# Patient Record
Sex: Male | Born: 1960 | Race: White | Hispanic: No | Marital: Single | Smoking: Current every day smoker
Health system: Southern US, Community
[De-identification: ages and names within clinical notes are randomized; demographics above are authoritative.]

## PROBLEM LIST (undated history)

## (undated) DIAGNOSIS — J45909 Unspecified asthma, uncomplicated: Secondary | ICD-10-CM

## (undated) DIAGNOSIS — J439 Emphysema, unspecified: Secondary | ICD-10-CM

## (undated) DIAGNOSIS — J449 Chronic obstructive pulmonary disease, unspecified: Secondary | ICD-10-CM

## (undated) HISTORY — DX: Unspecified asthma, uncomplicated: J45.909

## (undated) HISTORY — PX: KNEE SURGERY: SHX244

## (undated) HISTORY — DX: Chronic obstructive pulmonary disease, unspecified: J44.9

## (undated) HISTORY — PX: HERNIA REPAIR: SHX51

## (undated) HISTORY — DX: Emphysema, unspecified: J43.9

---

## 2012-12-07 ENCOUNTER — Emergency Department: Payer: Self-pay | Admitting: Emergency Medicine

## 2012-12-17 ENCOUNTER — Emergency Department: Payer: Self-pay | Admitting: Emergency Medicine

## 2012-12-17 LAB — CBC
HCT: 39.1 % — ABNORMAL LOW (ref 40.0–52.0)
MCH: 31.4 pg (ref 26.0–34.0)
MCV: 93 fL (ref 80–100)
Platelet: 210 10*3/uL (ref 150–440)
RBC: 4.23 10*6/uL — ABNORMAL LOW (ref 4.40–5.90)
WBC: 13.4 10*3/uL — ABNORMAL HIGH (ref 3.8–10.6)

## 2012-12-17 LAB — BASIC METABOLIC PANEL
Anion Gap: 6 — ABNORMAL LOW (ref 7–16)
EGFR (Non-African Amer.): 58 — ABNORMAL LOW
Glucose: 101 mg/dL — ABNORMAL HIGH (ref 65–99)
Osmolality: 284 (ref 275–301)

## 2013-02-13 ENCOUNTER — Emergency Department: Payer: Self-pay | Admitting: Emergency Medicine

## 2015-09-23 ENCOUNTER — Emergency Department
Admission: EM | Admit: 2015-09-23 | Discharge: 2015-09-23 | Disposition: A | Discharge status: Home / Self Care | Payer: Self-pay | Attending: Emergency Medicine | Admitting: Emergency Medicine

## 2015-09-23 ENCOUNTER — Encounter: Payer: Self-pay | Admitting: Emergency Medicine

## 2015-09-23 DIAGNOSIS — K047 Periapical abscess without sinus: Secondary | ICD-10-CM | POA: Insufficient documentation

## 2015-09-23 MED ORDER — IBUPROFEN 600 MG PO TABS: 600.0000 mg | ORAL_TABLET | Freq: Three times a day (TID) | ORAL | 0 refills | Status: DC | PRN

## 2015-09-23 MED ORDER — AMOXICILLIN 500 MG PO CAPS: 500.0000 mg | ORAL_CAPSULE | Freq: Three times a day (TID) | ORAL | 0 refills | Status: DC

## 2015-09-23 NOTE — ED Provider Notes (Signed)
[facility] Emergency Department Provider Note   ____________________________________________   First MD Initiated Contact with Patient 09/23/15 [phone]     (approximate)  I have reviewed the triage vital signs and the nursing notes.   HISTORY  Chief Complaint Oral Swelling    HPI Bobby Payne is a 55 y.o. male is here with complaint of dental pain.Patient states he broke a crown off several months ago but in the last couple of days has developed pain along with facial swelling. Patient states that the swelling gradually got worse last evening and he has been taking ibuprofen with no relief. Patient states that he tried to contact a dentist without success. He denies any fever or chills. There's been no nausea vomiting. Currently he rates his pain as 6/10.   History reviewed. No pertinent past medical history.  There are no active problems to display for this patient.   History reviewed. No pertinent surgical history.  Prior to Admission medications   Medication Sig Start Date End Date Taking? Authorizing Provider  amoxicillin (AMOXIL) 500 MG capsule Take 1 capsule (500 mg total) by mouth 3 (three) times daily. 09/23/15   Tommi Rumps, PA-C  ibuprofen (ADVIL,MOTRIN) 600 MG tablet Take 1 tablet (600 mg total) by mouth every 8 (eight) hours as needed. 09/23/15   Tommi Rumps, PA-C    Allergies Review of patient's allergies indicates no known allergies.  No family history on file.  Social History Social History  Substance Use Topics  . Smoking status: Not on file  . Smokeless tobacco: Not on file  . Alcohol use Not on file    Review of Systems Constitutional: No fever/chills Cardiovascular: Denies chest pain. Respiratory: Denies shortness of breath. Gastrointestinal:   No nausea, no vomiting.   Musculoskeletal: Negative for back pain. Skin: Negative for rash. Neurological: Negative for headaches, focal weakness or  numbness.  10-point ROS otherwise negative.  ____________________________________________   PHYSICAL EXAM:  VITAL SIGNS: ED Triage Vitals  Enc Vitals Group     BP 09/23/15 0915 (!) 142/85     Pulse Rate 09/23/15 0915 68     Resp 09/23/15 0915 18     Temp 09/23/15 0915 97.6 F (36.4 C)     Temp Source 09/23/15 0915 Oral     SpO2 09/23/15 0915 96 %     Weight 09/23/15 0918 186 lb (84.4 kg)     Height 09/23/15 0918 5\' 9"  (1.753 m)     Head Circumference --      Peak Flow --      Pain Score 09/23/15 0918 6     Pain Loc --      Pain Edu? --      Excl. in GC? --     Constitutional: Alert and oriented. Well appearing and in no acute distress. Eyes: Conjunctivae are normal. PERRL. EOMI. Head: Atraumatic. Nose: No congestion/rhinnorhea. Mouth/Throat: Mucous membranes are moist.  Oropharynx non-erythematous.Right upper gum with edema and tenderness. No active drainage was seen. Patient currently wearing a bridge that was removed for exam. Neck: No stridor.   Hematological/Lymphatic/Immunilogical: No cervical lymphadenopathy. Cardiovascular: Normal rate, regular rhythm. Grossly normal heart sounds.  Good peripheral circulation. Respiratory: Normal respiratory effort.  No retractions. Lungs CTAB. Musculoskeletal: No lower extremity tenderness nor edema.  No joint effusions. Neurologic:  Normal speech and language. No gross focal neurologic deficits are appreciated.  Skin:  Skin is warm, dry and intact. No rash noted. Psychiatric: Mood and affect are normal.  Speech and behavior are normal.  ____________________________________________   LABS (all labs ordered are listed, but only abnormal results are displayed)  Labs Reviewed - No data to display   PROCEDURES  Procedure(s) performed: None  Procedures  Critical Care performed: No  ____________________________________________   INITIAL IMPRESSION / ASSESSMENT AND PLAN / ED COURSE  Pertinent labs & imaging results that  were available during my care of the patient were reviewed by me and considered in my medical decision making (see chart for details).    Clinical Course   Patient was given a prescription for amoxicillin 500 mg 3 times a day for 10 days. He is also given a prescription for an ibuprofen to be taken for pain and inflammation. He was also given list of all the dental clinics that he is eligible for since he does not have any insurance.  ____________________________________________   FINAL CLINICAL IMPRESSION(S) / ED DIAGNOSES  Final diagnoses:  Dental abscess      NEW MEDICATIONS STARTED DURING THIS VISIT:  Discharge Medication List as of 09/23/2015  9:38 AM    START taking these medications   Details  amoxicillin (AMOXIL) 500 MG capsule Take 1 capsule (500 mg total) by mouth 3 (three) times daily., Starting Sat 09/23/2015, Print    ibuprofen (ADVIL,MOTRIN) 600 MG tablet Take 1 tablet (600 mg total) by mouth every 8 (eight) hours as needed., Starting Sat 09/23/2015, Print         Note:  This document was prepared using Dragon voice recognition software and may include unintentional dictation errors.    Tommi RumpsRhonda L Rodman Recupero, PA-C 09/23/15 1202    Sharyn CreamerMark Quale, MD 09/23/15 1535

## 2015-09-23 NOTE — ED Triage Notes (Signed)
Awoke with swollen face this am R side, states has crown that came off that side.

## 2015-09-23 NOTE — ED Notes (Signed)
Pt alert and oriented X4, active, cooperative, pt in NAD. RR even and unlabored, color WNL.  Pt informed to return if any life threatening symptoms occur.   

## 2015-09-23 NOTE — ED Notes (Signed)
Pt comes in to ED w/ c/o of R. Sided dental pain. R. Cheek swelling noted upon assessment. Per pt this began last night and swelling has gradually increased-pain 6/10. States took ibuprofen with no relief, tried to contact dentist w/ no success. Significant dental hx, thinks "may have infection from broken crown". Denies difficulty breathing. NAD noted.

## 2015-09-23 NOTE — Discharge Instructions (Signed)
OPTIONS FOR DENTAL FOLLOW UP CARE ° °[place] Department of Health and Human Services - Local Safety Net Dental Clinics °http://www.ncdhhs.gov/dph/oralhealth/services/safetynetclinics.htm °  °[facility] (336-562-3123) ° °[facility] [place] (919-933-9087) ° °[facility] (919-663-1744 ext [phone]) ° °[facility] ([phone]) ° °[facility] ([phone]) °This clinic caters to the indigent population and is on a lottery system. °Location: °[facility], [address], [place] °Clinic Hours: °Wednesdays from 6pm - 9pm, patients seen by a lottery system. °For dates, call or go to [email or web address].[facility].edu/[facility]/patients/Dental-[facility] °Services: °Cleanings, fillings and simple extractions. °Payment Options: °DENTAL WORK IS FREE OF CHARGE. Bring proof of income or support. °Best way to get seen: °Arrive at 5:15 pm - this is a lottery, NOT first come/first serve, so arriving earlier will not increase your chances of being seen. °  °  °[facility] °[phone] °Select option 1 for emergencies °  °Location: °[facility], [address], [place] °Clinic Hours: °No walk-ins accepted - call the day before to schedule an appointment. °Check in times are 9:30 am and 1:30 pm. °Services: °Simple extractions, temporary fillings, pulpectomy/pulp debridement, uncomplicated abscess drainage. °Payment Options: °PAYMENT IS DUE AT THE TIME OF SERVICE.  Fee is usually $100-200, additional surgical procedures (e.g. abscess drainage) may be extra. °Cash, checks, Visa/MasterCard accepted.  Can file Medicaid if patient is covered for dental - patient should call case worker to check. °No discount for [facility] patients. °Best way to get seen: °MUST call the day before and get onto the schedule. Can usually be seen the next 1-2 days. No walk-ins accepted. °  °  °[facility] °[phone] °   °Location: °[facility], [address], [place] °Clinic Hours: °M, W, Th, F 8am or 1:30pm, Tues 9a or 1:30 - first come/first served. °Services: °Simple extractions, temporary fillings, uncomplicated abscess drainage.  You do not need to be an [place] resident. °Payment Options: °PAYMENT IS DUE AT THE TIME OF SERVICE. °Dental insurance, otherwise sliding scale - bring proof of income or support. °Depending on income and treatment needed, cost is usually $50-200. °Best way to get seen: °Arrive early as it is first come/first served. °  °  °[facility] °[phone] °  °Location: °[address] °Clinic Hours: °Mon-Thu 8a-5p °Services: °Most basic dental services including extractions and fillings. °Payment Options: °PAYMENT IS DUE AT THE TIME OF SERVICE. °Sliding scale, up to 50% off - bring proof if income or support. °Medicaid with dental option accepted. °Best way to get seen: °Call to schedule an appointment, can usually be seen within 2 weeks OR they will try to see walk-ins - show up at 8a or 2p (you may have to wait). °  °  °[facility] °919-245-2435 °[place] RESIDENTS ONLY °  °Location: °[facility], [address], [place], [place] [address] °Clinic Hours: By appointment only. °Monday - Thursday 8am-5pm, Friday 8am-12pm °Services: Cleanings, fillings, extractions. °Payment Options: °PAYMENT IS DUE AT THE TIME OF SERVICE. °Cash, Visa or MasterCard. Sliding scale - $30 minimum per service. °Best way to get seen: °Come in to office, complete packet and make an appointment - need proof of income °or support monies for each household member and proof of [place] residence. °Usually takes about a month to get in. °  °  °[facility] °[phone] °  °Location: °[address]   [place] °Clinic Hours: Walk-in Urgent Care Dental Services are offered Monday-Friday  mornings only. °The numbers of emergencies accepted daily is limited to the number of °providers available. °Maximum 15 - Mondays, Wednesdays & Thursdays °Maximum 10 - Tuesdays & Fridays °Services: °You do not need to be a [place] resident to be seen for a dental emergency. °Emergencies are defined as pain, swelling, abnormal bleeding, or dental trauma. Walkins will receive x-rays if needed. °NOTE: Dental cleaning is not an emergency. °Payment Options: °PAYMENT IS DUE AT THE TIME OF SERVICE. °Minimum co-pay is $40.00 for uninsured patients. °Minimum co-pay is $3.00 for Medicaid with dental coverage. °Dental Insurance is accepted and must be presented at time of visit. °Medicare does not cover dental. °Forms of payment: Cash, credit card, checks. °Best way to get seen: °If not previously registered with the clinic, walk-in dental registration begins at 7:15 am and is on a first come/first serve basis. °If previously registered with the clinic, call to make an appointment. °  °  °[facility] °[phone] °[place] RESIDENTS ONLY °  °Location: °[address], [place], [place] °Clinic Hours: °Mon-Thu 10a-2p °Services: Extractions only! °Payment Options: °FREE (donations accepted) - bring proof of income or support °Best way to get seen: °Call and schedule an appointment OR come at 8am on the 1st Monday of every month (except for holidays) when it is first come/first served. °  °  °[facility] °[phone] °  °Location: °[address], [place] °Clinic Hours: °Friday mornings °Services, Payment Options, Best way to get seen: °Call for info °

## 2017-06-13 ENCOUNTER — Emergency Department: Payer: Self-pay

## 2017-06-13 ENCOUNTER — Other Ambulatory Visit: Payer: Self-pay

## 2017-06-13 DIAGNOSIS — R05 Cough: Secondary | ICD-10-CM | POA: Insufficient documentation

## 2017-06-13 DIAGNOSIS — Z5321 Procedure and treatment not carried out due to patient leaving prior to being seen by health care provider: Secondary | ICD-10-CM | POA: Insufficient documentation

## 2017-06-13 LAB — CBC WITH DIFFERENTIAL/PLATELET
Basophils Absolute: 0 10*3/uL (ref 0–0.1)
Basophils Relative: 1 %
EOS ABS: 0.1 10*3/uL (ref 0–0.7)
Eosinophils Relative: 1 %
HCT: 44.6 % (ref 40.0–52.0)
HEMOGLOBIN: 15.4 g/dL (ref 13.0–18.0)
Lymphocytes Relative: 40 %
Lymphs Abs: 3 10*3/uL (ref 1.0–3.6)
MCH: 32.6 pg (ref 26.0–34.0)
MCHC: 34.5 g/dL (ref 32.0–36.0)
MCV: 94.5 fL (ref 80.0–100.0)
MONOS PCT: 8 %
Monocytes Absolute: 0.6 10*3/uL (ref 0.2–1.0)
NEUTROS PCT: 50 %
Neutro Abs: 3.9 10*3/uL (ref 1.4–6.5)
Platelets: 207 10*3/uL (ref 150–440)
RBC: 4.72 MIL/uL (ref 4.40–5.90)
RDW: 13.9 % (ref 11.5–14.5)
WBC: 7.6 10*3/uL (ref 3.8–10.6)

## 2017-06-13 LAB — BASIC METABOLIC PANEL
Anion gap: 8 (ref 5–15)
BUN: 20 mg/dL (ref 6–20)
CHLORIDE: 106 mmol/L (ref 101–111)
CO2: 29 mmol/L (ref 22–32)
CREATININE: 0.96 mg/dL (ref 0.61–1.24)
Calcium: 9.1 mg/dL (ref 8.9–10.3)
GFR calc non Af Amer: >60 mL/min (ref >60)
Glucose, Bld: 110 mg/dL — ABNORMAL HIGH (ref 65–99)
Potassium: 4.6 mmol/L (ref 3.5–5.1)
SODIUM: 143 mmol/L (ref 135–145)

## 2017-06-13 NOTE — ED Triage Notes (Signed)
Patient reports cough for approximately 1 week.  Reports recently started smoking again.

## 2017-06-14 ENCOUNTER — Emergency Department
Admission: EM | Admit: 2017-06-14 | Discharge: 2017-06-14 | Disposition: A | Discharge status: Home / Self Care | Payer: Self-pay | Attending: Emergency Medicine | Admitting: Emergency Medicine

## 2018-04-08 ENCOUNTER — Other Ambulatory Visit: Payer: Self-pay

## 2018-04-08 DIAGNOSIS — F17210 Nicotine dependence, cigarettes, uncomplicated: Secondary | ICD-10-CM | POA: Diagnosis not present

## 2018-04-08 DIAGNOSIS — K0889 Other specified disorders of teeth and supporting structures: Secondary | ICD-10-CM | POA: Diagnosis present

## 2018-04-08 DIAGNOSIS — K047 Periapical abscess without sinus: Secondary | ICD-10-CM | POA: Insufficient documentation

## 2018-04-09 ENCOUNTER — Emergency Department
Admission: EM | Admit: 2018-04-09 | Discharge: 2018-04-09 | Disposition: A | Discharge status: Home / Self Care | Payer: Commercial Managed Care - PPO | Attending: Emergency Medicine | Admitting: Emergency Medicine

## 2018-04-09 ENCOUNTER — Encounter: Payer: Self-pay | Admitting: *Deleted

## 2018-04-09 ENCOUNTER — Other Ambulatory Visit: Payer: Self-pay

## 2018-04-09 DIAGNOSIS — K047 Periapical abscess without sinus: Secondary | ICD-10-CM

## 2018-04-09 DIAGNOSIS — K0889 Other specified disorders of teeth and supporting structures: Secondary | ICD-10-CM

## 2018-04-09 MED ORDER — AMOXICILLIN 500 MG PO CAPS
500.0000 mg | ORAL_CAPSULE | Freq: Once | ORAL | Status: AC
  Administered 2018-04-09: 500 mg via ORAL
  Filled 2018-04-09: qty 1

## 2018-04-09 MED ORDER — IBUPROFEN 600 MG PO TABS: 600.0000 mg | ORAL_TABLET | Freq: Three times a day (TID) | ORAL | 0 refills | Status: DC | PRN

## 2018-04-09 MED ORDER — AMOXICILLIN 500 MG PO CAPS: 500.0000 mg | ORAL_CAPSULE | Freq: Three times a day (TID) | ORAL | 0 refills | Status: AC

## 2018-04-09 MED ORDER — HYDROCODONE-ACETAMINOPHEN 5-325 MG PO TABS: 1.0000 | ORAL_TABLET | Freq: Four times a day (QID) | ORAL | 0 refills | Status: DC | PRN

## 2018-04-09 MED ORDER — AMOXICILLIN 500 MG PO CAPS: 500.0000 mg | ORAL_CAPSULE | Freq: Three times a day (TID) | ORAL | 0 refills | Status: DC

## 2018-04-09 NOTE — ED Triage Notes (Signed)
Pt has right upper toothache with pain and swelling since yesterday.  Pt alert.

## 2018-04-09 NOTE — ED Provider Notes (Signed)
[facility] Emergency Department Provider Note   ____________________________________________   First MD Initiated Contact with Patient 04/09/18 [phone]     (approximate)  I have reviewed the triage vital signs and the nursing notes.   HISTORY  Chief Complaint Dental Pain    HPI Bobby Payne is a 58 y.o. male who presents to the ED from home with a chief complaint of dental pain.  Patient only has 2 real teeth left in his mouth.  Otherwise he wears a crown and dentures.  Has had issues with a crown on his right upper tooth which has been cracked for a while.  Presents with a request for antibiotics only, no pain medicines.  Denies associated fever, chills, facial swelling, chest pain, shortness of breath, abdominal pain, nausea or vomiting.  Denies recent travel or trauma.      Past medical history None  There are no active problems to display for this patient.   No past surgical history on file.  Prior to Admission medications   Medication Sig Start Date End Date Taking? Authorizing Provider  amoxicillin (AMOXIL) 500 MG capsule Take 1 capsule (500 mg total) by mouth 3 (three) times daily. 04/09/18   Irean Hong, MD    Allergies Patient has no known allergies.  No family history on file.  Social History Social History   Tobacco Use  . Smoking status: Current Every Day Smoker  . Smokeless tobacco: Never Used  Substance Use Topics  . Alcohol use: Not Currently  . Drug use: Not Currently    Review of Systems  Constitutional: No fever/chills Eyes: No visual changes. ENT: Positive for dentalgia.  No sore throat. Cardiovascular: Denies chest pain. Respiratory: Denies shortness of breath. Gastrointestinal: No abdominal pain.  No nausea, no vomiting.  No diarrhea.  No constipation. Genitourinary: Negative for dysuria. Musculoskeletal: Negative for back pain. Skin: Negative for rash. Neurological: Negative for headaches, focal weakness  or numbness.   ____________________________________________   PHYSICAL EXAM:  VITAL SIGNS: ED Triage Vitals  Enc Vitals Group     BP 04/09/18 0004 119/75     Pulse Rate 04/09/18 0004 74     Resp 04/09/18 0004 20     Temp 04/09/18 0004 98.7 F (37.1 C)     Temp Source 04/09/18 0004 Oral     SpO2 04/09/18 0004 98 %     Weight 04/09/18 0003 187 lb (84.8 kg)     Height 04/09/18 0003 5\' 9"  (1.753 m)     Head Circumference --      Peak Flow --      Pain Score 04/09/18 0003 7     Pain Loc --      Pain Edu? --      Excl. in GC? --     Constitutional: Alert and oriented. Well appearing and in no acute distress. Eyes: Conjunctivae are normal. PERRL. EOMI. Head: Atraumatic. Nose: No congestion/rhinnorhea. Mouth/Throat: Mucous membranes are moist.  Small abscess to right upper molar.  No facial swelling. Neck: No stridor.   Cardiovascular: Normal rate, regular rhythm. Grossly normal heart sounds.  Good peripheral circulation. Respiratory: Normal respiratory effort.  No retractions. Lungs CTAB. Gastrointestinal: Soft and nontender. No distention. No abdominal bruits. No CVA tenderness. Musculoskeletal: No lower extremity tenderness nor edema.  No joint effusions. Neurologic:  Normal speech and language. No gross focal neurologic deficits are appreciated. No gait instability. Skin:  Skin is warm, dry and intact. No rash noted. Psychiatric: Mood and affect  are normal. Speech and behavior are normal.  ____________________________________________   LABS (all labs ordered are listed, but only abnormal results are displayed)  Labs Reviewed - No data to display ____________________________________________  EKG  None ____________________________________________  RADIOLOGY  ED MD interpretation: None  Official radiology report(s): No results found.  ____________________________________________   PROCEDURES  Procedure(s) performed (including Critical Care):  Procedures    ____________________________________________   INITIAL IMPRESSION / ASSESSMENT AND PLAN / ED COURSE  As part of my medical decision making, I reviewed the following data within the electronic MEDICAL RECORD NUMBER Nursing notes reviewed and incorporated and Notes from prior ED visits        58 year old male who presents with dentalgia secondary to dental abscess.  Presents for antibiotics only.  Recently received dental insurance and is going to try to get a appointment with his dentist which will be difficult given the current coronavirus pandemic.  Strict return precautions given.  Patient verbalizes understanding agrees with plan of care.      ____________________________________________   FINAL CLINICAL IMPRESSION(S) / ED DIAGNOSES  Final diagnoses:  Tooth pain  Dental infection     ED Discharge Orders         Ordered    amoxicillin (AMOXIL) 500 MG capsule  3 times daily,   Status:  Discontinued     04/09/18 0047    ibuprofen (ADVIL,MOTRIN) 600 MG tablet  Every 8 hours PRN,   Status:  Discontinued     04/09/18 0047    HYDROcodone-acetaminophen (NORCO) 5-325 MG tablet  Every 6 hours PRN,   Status:  Discontinued     04/09/18 0047    amoxicillin (AMOXIL) 500 MG capsule  3 times daily     04/09/18 0053           Note:  This document was prepared using Dragon voice recognition software and may include unintentional dictation errors.   Irean Hong, MD 04/09/18 [phone]

## 2018-04-09 NOTE — Discharge Instructions (Addendum)
1. Take antibiotic as prescribed (Amoxicillin 500mg  three times daily x 7 days). 2. Return to the ER for worsening symptoms, persistent vomiting, fever, difficulty breathing or other concerns.

## 2018-08-25 ENCOUNTER — Telehealth: Payer: Self-pay | Admitting: *Deleted

## 2018-08-25 ENCOUNTER — Encounter: Payer: Self-pay | Admitting: *Deleted

## 2018-08-25 DIAGNOSIS — Z87891 Personal history of nicotine dependence: Secondary | ICD-10-CM

## 2018-08-25 DIAGNOSIS — Z122 Encounter for screening for malignant neoplasm of respiratory organs: Secondary | ICD-10-CM

## 2018-08-25 NOTE — Telephone Encounter (Signed)
Received referral for initial lung cancer screening scan. Contacted patient and obtained smoking history,(current, 55 pack year) as well as answering questions related to screening process. Patient denies signs of lung cancer such as weight loss or hemoptysis. Patient denies comorbidity that would prevent curative treatment if lung cancer were found. Patient is scheduled for shared decision making visit and CT scan on 09/11/18 at [place].

## 2018-09-11 ENCOUNTER — Inpatient Hospital Stay: Payer: Commercial Managed Care - PPO | Attending: Nurse Practitioner | Admitting: Hospice and Palliative Medicine

## 2018-09-11 ENCOUNTER — Other Ambulatory Visit: Payer: Self-pay

## 2018-09-11 ENCOUNTER — Ambulatory Visit
Admission: RE | Admit: 2018-09-11 | Discharge: 2018-09-11 | Disposition: A | Discharge status: Home / Self Care | Payer: Commercial Managed Care - PPO | Source: Ambulatory Visit | Attending: Nurse Practitioner | Admitting: Nurse Practitioner

## 2018-09-11 DIAGNOSIS — Z87891 Personal history of nicotine dependence: Secondary | ICD-10-CM | POA: Insufficient documentation

## 2018-09-11 DIAGNOSIS — Z122 Encounter for screening for malignant neoplasm of respiratory organs: Secondary | ICD-10-CM

## 2018-09-11 DIAGNOSIS — F17210 Nicotine dependence, cigarettes, uncomplicated: Secondary | ICD-10-CM

## 2018-09-11 NOTE — Progress Notes (Signed)
In accordance with CMS guidelines, patient has met eligibility criteria including age, absence of signs or symptoms of lung cancer.  Social History   Tobacco Use  . Smoking status: Current Every Day Smoker    Packs/day: 1.25    Years: 44.00    Pack years: 55.00    Types: Cigarettes  . Smokeless tobacco: Never Used  Substance Use Topics  . Alcohol use: Not Currently  . Drug use: Not Currently      A shared decision-making session was conducted prior to the performance of CT scan. This includes one or more decision aids, includes benefits and harms of screening, follow-up diagnostic testing, over-diagnosis, false positive rate, and total radiation exposure.   Counseling on the importance of adherence to annual lung cancer LDCT screening, impact of co-morbidities, and ability or willingness to undergo diagnosis and treatment is imperative for compliance of the program.   Counseling on the importance of continued smoking cessation for former smokers; the importance of smoking cessation for current smokers, and information about tobacco cessation interventions have been given to patient including [place] and 1800 quit [place] programs.   Written order for lung cancer screening with LDCT has been given to the patient and any and all questions have been answered to the best of my abilities.    Yearly follow up will be coordinated by Burgess Estelle, Thoracic Navigator.  Time Total: 15 minutes  Visit consisted of counseling and education dealing with complex health screening. Greater than 50%  of this time was spent counseling and coordinating care related to the above assessment and plan.  Signed by: Altha Harm, PhD, NP-C [phone] (Work Cell)

## 2018-09-14 ENCOUNTER — Telehealth: Payer: Self-pay | Admitting: *Deleted

## 2018-09-14 NOTE — Telephone Encounter (Signed)
After review of results with pulmonology, Notified patient of LDCT lung cancer screening program results with recommendation for 3 month follow up imaging. Also notified of incidental findings noted below and is encouraged to discuss further with PCP who will receive a copy of this note and/or the CT report. Patient verbalizes understanding.   IMPRESSION: 1. Lung-RADS 4A, suspicious. Follow up low-dose chest CT without contrast in 3 months (please use the following order, "CT CHEST LCS NODULE FOLLOW-UP W/O CM") is recommended. Alternatively, PET may be considered when there is a solid component 59mm or larger. 2.  Emphysema (ICD10-J43.9).

## 2018-09-21 ENCOUNTER — Other Ambulatory Visit: Payer: Self-pay | Admitting: Internal Medicine

## 2018-09-21 DIAGNOSIS — R911 Solitary pulmonary nodule: Secondary | ICD-10-CM

## 2018-12-07 ENCOUNTER — Telehealth: Payer: Self-pay | Admitting: *Deleted

## 2018-12-07 ENCOUNTER — Other Ambulatory Visit: Payer: Self-pay

## 2018-12-07 ENCOUNTER — Ambulatory Visit
Admission: RE | Admit: 2018-12-07 | Discharge: 2018-12-07 | Disposition: A | Discharge status: Home / Self Care | Payer: Commercial Managed Care - PPO | Source: Ambulatory Visit | Attending: Internal Medicine | Admitting: Internal Medicine

## 2018-12-07 DIAGNOSIS — R918 Other nonspecific abnormal finding of lung field: Secondary | ICD-10-CM

## 2018-12-07 DIAGNOSIS — Z87891 Personal history of nicotine dependence: Secondary | ICD-10-CM

## 2018-12-07 DIAGNOSIS — R911 Solitary pulmonary nodule: Secondary | ICD-10-CM

## 2018-12-07 DIAGNOSIS — Z122 Encounter for screening for malignant neoplasm of respiratory organs: Secondary | ICD-10-CM

## 2018-12-07 NOTE — Telephone Encounter (Signed)
Contacted and scheduled for LCS nodule follow up scan 

## 2018-12-09 ENCOUNTER — Telehealth: Payer: Self-pay | Admitting: *Deleted

## 2018-12-14 ENCOUNTER — Other Ambulatory Visit: Payer: Self-pay

## 2018-12-14 ENCOUNTER — Ambulatory Visit
Admission: RE | Admit: 2018-12-14 | Discharge: 2018-12-14 | Disposition: A | Discharge status: Home / Self Care | Payer: Commercial Managed Care - PPO | Source: Ambulatory Visit | Attending: Nurse Practitioner | Admitting: Nurse Practitioner

## 2018-12-14 DIAGNOSIS — R918 Other nonspecific abnormal finding of lung field: Secondary | ICD-10-CM | POA: Diagnosis present

## 2018-12-14 DIAGNOSIS — Z122 Encounter for screening for malignant neoplasm of respiratory organs: Secondary | ICD-10-CM | POA: Insufficient documentation

## 2018-12-14 DIAGNOSIS — Z87891 Personal history of nicotine dependence: Secondary | ICD-10-CM | POA: Insufficient documentation

## 2018-12-21 ENCOUNTER — Encounter: Payer: Self-pay | Admitting: *Deleted

## 2019-09-16 IMAGING — CT CT CHEST LUNG CANCER SCREENING LOW DOSE
2 of 5 series · 15 of 40 positions shown, 18 images · non-contrast
Comparison: None.

CLINICAL DATA: Lung cancer screening. Current 55 pack-year history
smoker. Asymptomatic.

EXAM:
CT CHEST WITHOUT CONTRAST LOW-DOSE FOR LUNG CANCER SCREENING
TECHNIQUE: Multidetector CT imaging of the chest was performed following the
standard protocol without IV contrast.

[Series 3: thins lung · axial · 0.67mm/px · z∈[-1255,-905]mm · 12 of 386 slices shown, 15 images]
[im 18/386  mediastinal]
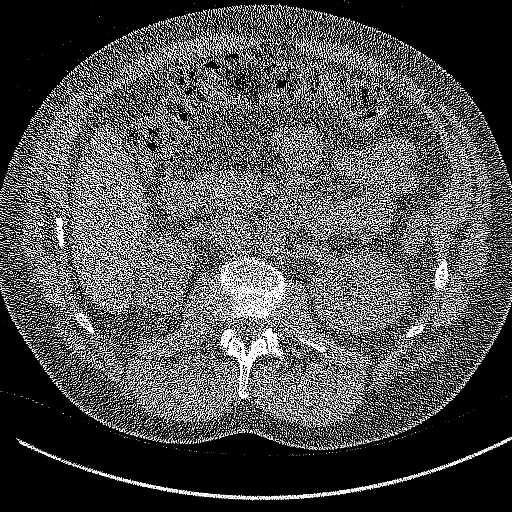
[im 18/386  lung]
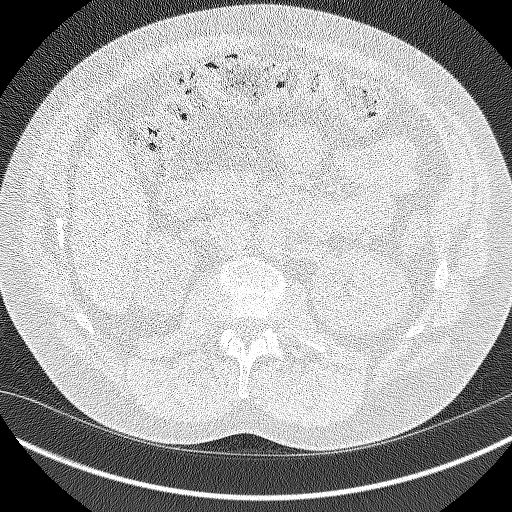
[im 53/386  lung]
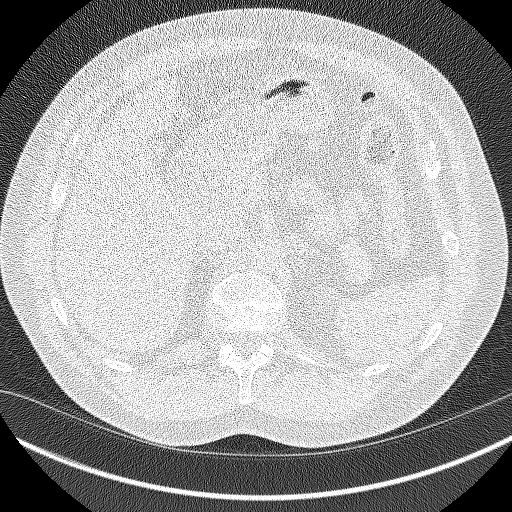
[im 88/386  lung]
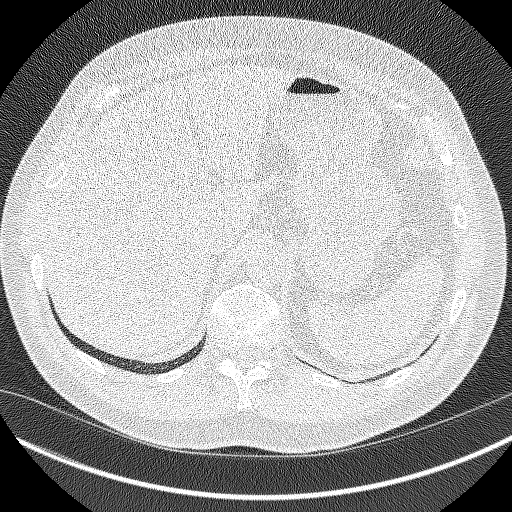
[im 123/386  lung]
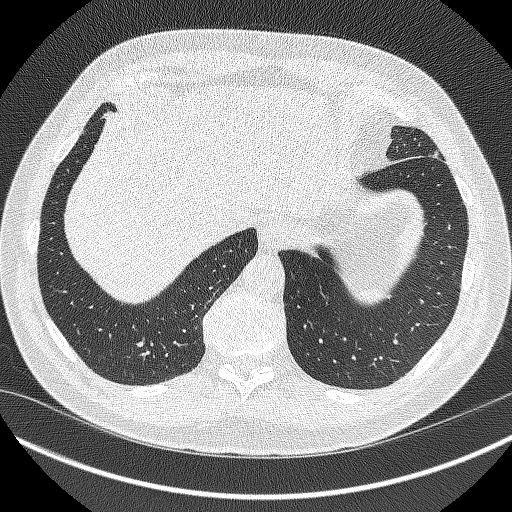
[im 141/386  mediastinal]
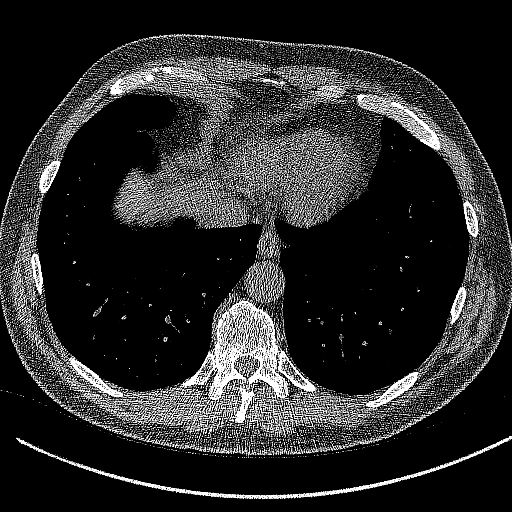
[im 141/386  lung]
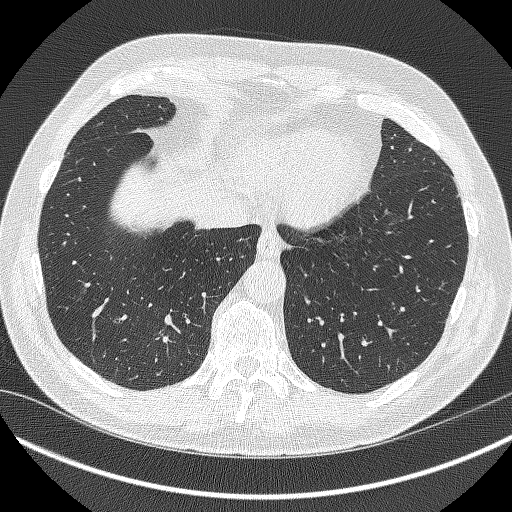
[im 176/386  lung]
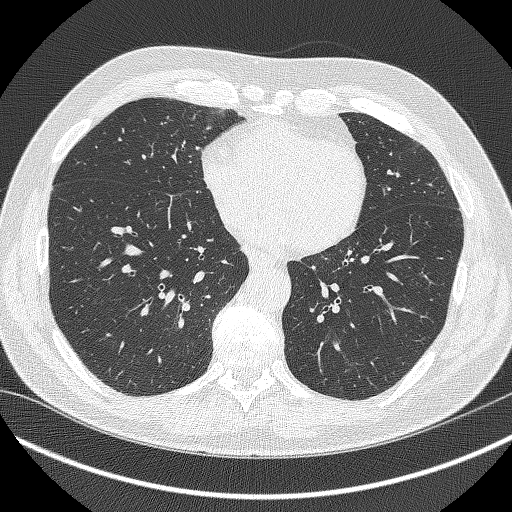
[im 211/386  lung]
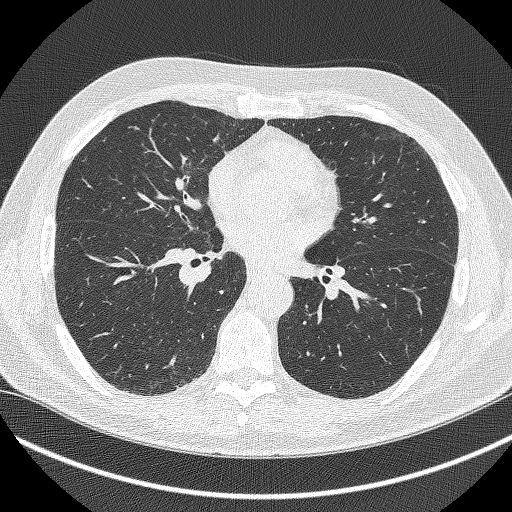
[im 246/386  lung]
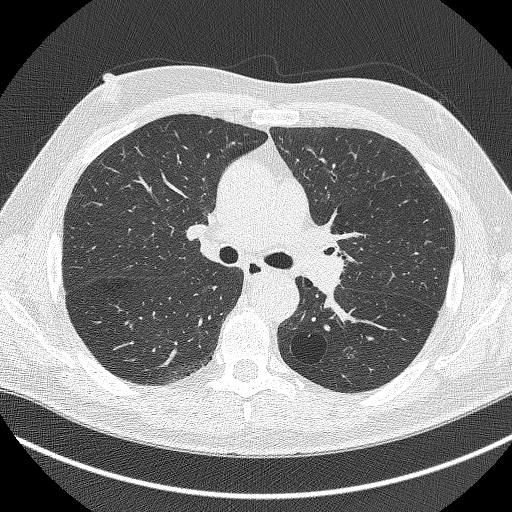
[im 263/386  mediastinal]
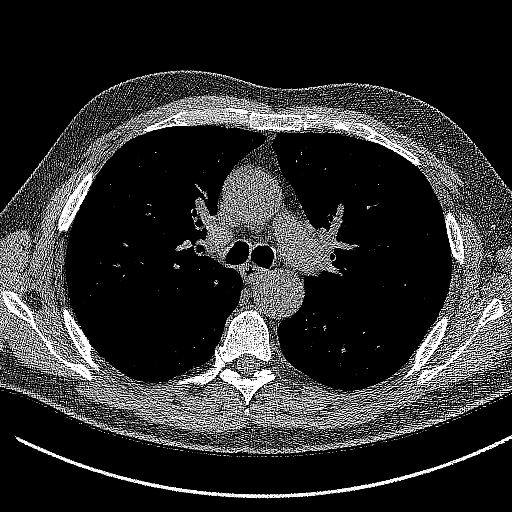
[im 263/386  lung]
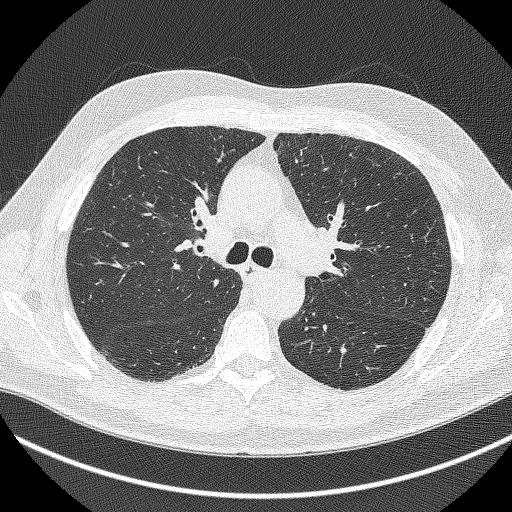
[im 298/386  lung]
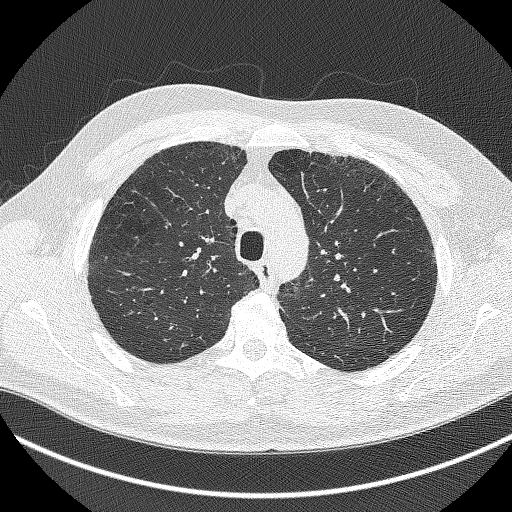
[im 333/386  lung]
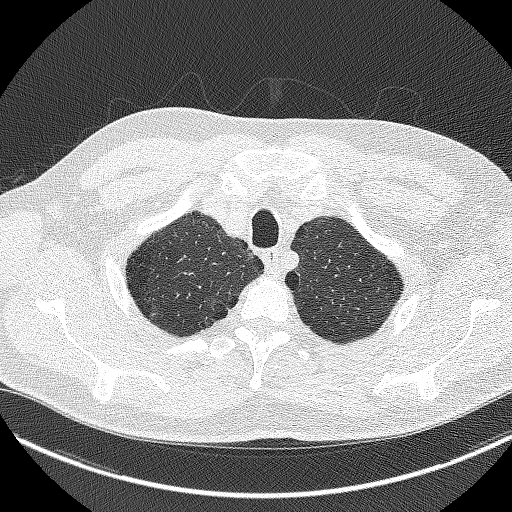
[im 368/386  lung]
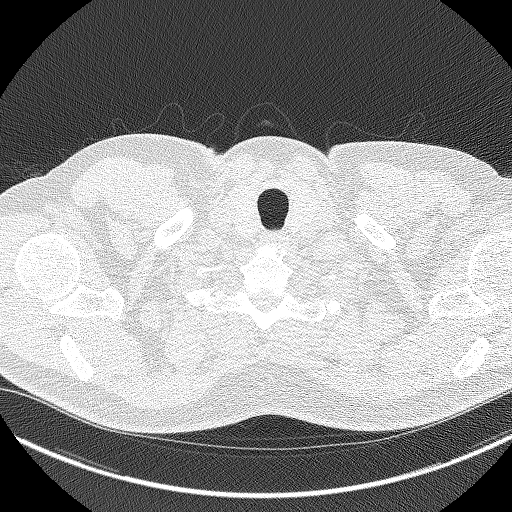

[Series 4: coronal lung · coronal · 0.67mm/px · 3 of 294 slices shown]
[im 59/294  lung]
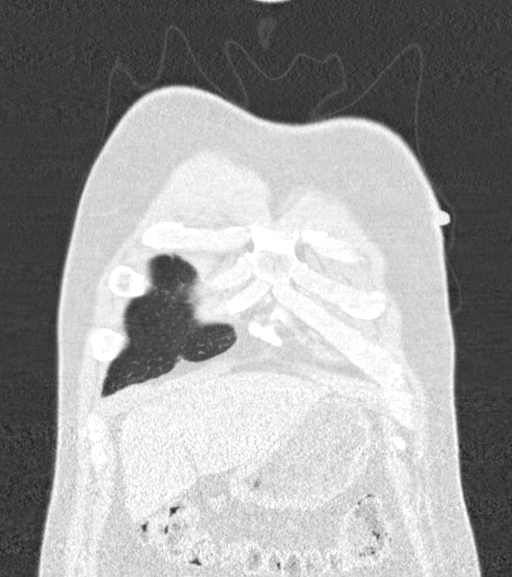
[im 118/294  lung]
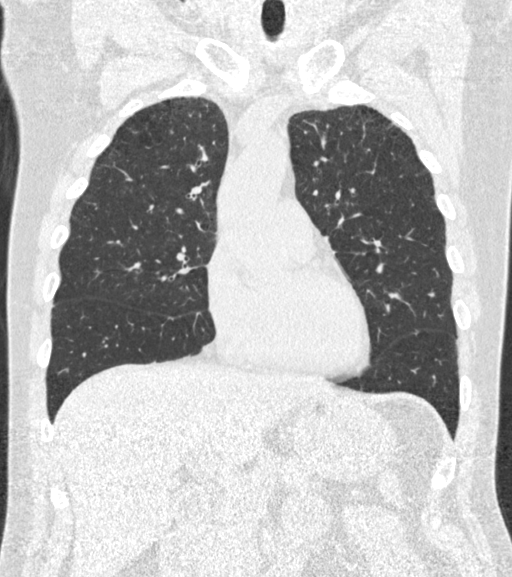
[im 176/294  lung]
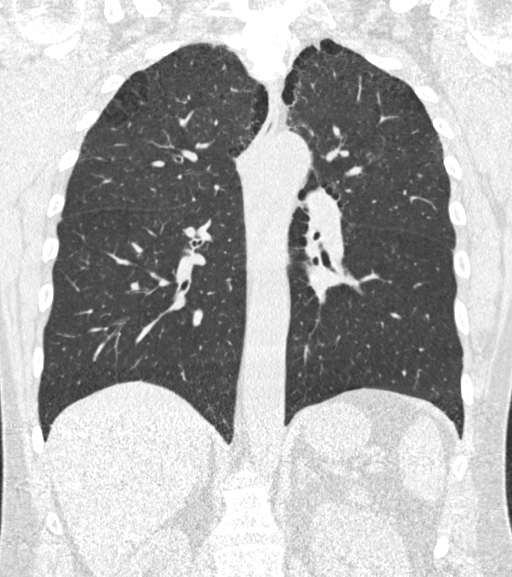

[15 of 40 positions shown; findings below may reference images not displayed]

FINDINGS: Cardiovascular: The heart size appears normal. No pericardial
effusion.

Mediastinum/Nodes: Normal appearance of the thyroid gland. The
trachea appears patent and is midline. Normal appearance of the
esophagus. No enlarged mediastinal or hilar lymph nodes.

Lungs/Pleura: Moderate changes of centrilobular and paraseptal
emphysema. No pleural effusion, airspace consolidation or
atelectasis. Pulmonary nodule within the posterior aspect of the
right lower lobe has an equivalent diameter of 8 mm. No additional
pulmonary nodules or masses identified. Calcified granuloma
identified within the anterolateral left lower lobe.

Upper Abdomen: No acute abnormality.

Musculoskeletal: No chest wall mass or suspicious bone lesions
identified.
IMPRESSION: 1. Lung-RADS 4A, suspicious. Follow up low-dose chest CT without
contrast in 3 months (please use the following order, "CT CHEST LCS
NODULE FOLLOW-UP W/O CM") is recommended. Alternatively, PET may be
considered when there is a solid component 8mm or larger.
2.  Emphysema ([id number]).

## 2019-09-20 ENCOUNTER — Ambulatory Visit: Payer: Self-pay | Attending: Internal Medicine

## 2019-09-20 DIAGNOSIS — Z23 Encounter for immunization: Secondary | ICD-10-CM

## 2019-09-20 NOTE — Progress Notes (Signed)
   Covid-19 Vaccination Clinic  Name:  Bobby Payne    MRN: [id number] DOB: 12/13/1960  09/20/2019  [name] was observed post Covid-19 immunization for 15 minutes without incident. He was provided with Vaccine Information Sheet and instruction to access the V-Safe system.   [name] was instructed to call 911 with any severe reactions post vaccine: Marland Kitchen Difficulty breathing  . Swelling of face and throat  . A fast heartbeat  . A bad rash all over body  . Dizziness and weakness   Immunizations Administered    Name Date Dose VIS Date Route   Pfizer COVID-19 Vaccine 09/20/2019  9:36 AM 0.3 mL 03/17/2018 Intramuscular   Manufacturer: [employer], [employer]   Lot: [id number]   NDC: [address]-3

## 2019-10-11 ENCOUNTER — Ambulatory Visit: Payer: Self-pay

## 2019-12-10 ENCOUNTER — Telehealth: Payer: Self-pay | Admitting: *Deleted

## 2019-12-10 NOTE — Telephone Encounter (Signed)
Attempted to contact to schedule lung screening scan. However, there is no answer or voicemail option available.  

## 2019-12-20 ENCOUNTER — Telehealth: Payer: Self-pay | Admitting: *Deleted

## 2019-12-20 NOTE — Telephone Encounter (Signed)
Attempted to contact and schedule lung screening scan. Message left for patient to call back to schedule. 

## 2020-01-12 ENCOUNTER — Telehealth: Payer: Self-pay | Admitting: *Deleted

## 2020-01-12 NOTE — Telephone Encounter (Signed)
Attempted to contact and schedule lung screening scan. Message left for patient to call back to schedule. 

## 2020-01-26 ENCOUNTER — Encounter: Payer: Self-pay | Admitting: *Deleted

## 2022-01-13 ENCOUNTER — Other Ambulatory Visit: Payer: Self-pay

## 2022-01-13 ENCOUNTER — Emergency Department: Payer: Self-pay

## 2022-01-13 ENCOUNTER — Emergency Department
Admission: EM | Admit: 2022-01-13 | Discharge: 2022-01-13 | Disposition: A | Discharge status: Home / Self Care | Payer: Self-pay | Attending: Emergency Medicine | Admitting: Emergency Medicine

## 2022-01-13 DIAGNOSIS — J449 Chronic obstructive pulmonary disease, unspecified: Secondary | ICD-10-CM | POA: Insufficient documentation

## 2022-01-13 DIAGNOSIS — R1013 Epigastric pain: Secondary | ICD-10-CM | POA: Insufficient documentation

## 2022-01-13 DIAGNOSIS — R112 Nausea with vomiting, unspecified: Secondary | ICD-10-CM | POA: Insufficient documentation

## 2022-01-13 DIAGNOSIS — F17200 Nicotine dependence, unspecified, uncomplicated: Secondary | ICD-10-CM | POA: Insufficient documentation

## 2022-01-13 DIAGNOSIS — R1084 Generalized abdominal pain: Secondary | ICD-10-CM

## 2022-01-13 DIAGNOSIS — J45909 Unspecified asthma, uncomplicated: Secondary | ICD-10-CM | POA: Insufficient documentation

## 2022-01-13 DIAGNOSIS — R1012 Left upper quadrant pain: Secondary | ICD-10-CM | POA: Insufficient documentation

## 2022-01-13 LAB — COMPREHENSIVE METABOLIC PANEL
ALT: 20 U/L (ref 0–44)
AST: 25 U/L (ref 15–41)
Albumin: 3.2 g/dL — ABNORMAL LOW (ref 3.5–5.0)
Alkaline Phosphatase: 41 U/L (ref 38–126)
Anion gap: 8 (ref 5–15)
BUN: 15 mg/dL (ref 8–23)
CO2: 24 mmol/L (ref 22–32)
Calcium: 8.3 mg/dL — ABNORMAL LOW (ref 8.9–10.3)
Chloride: 106 mmol/L (ref 98–111)
Creatinine, Ser: 0.74 mg/dL (ref 0.61–1.24)
GFR, Estimated: >60 mL/min (ref >60)
Glucose, Bld: 117 mg/dL — ABNORMAL HIGH (ref 70–99)
Potassium: 4.1 mmol/L (ref 3.5–5.1)
Sodium: 138 mmol/L (ref 135–145)
Total Bilirubin: 1 mg/dL (ref 0.3–1.2)
Total Protein: 6.5 g/dL (ref 6.5–8.1)

## 2022-01-13 LAB — URINALYSIS, ROUTINE W REFLEX MICROSCOPIC
Bilirubin Urine: NEGATIVE
Glucose, UA: NEGATIVE mg/dL
Hgb urine dipstick: NEGATIVE
Ketones, ur: 20 mg/dL — AB
Leukocytes,Ua: NEGATIVE
Nitrite: NEGATIVE
Protein, ur: NEGATIVE mg/dL
Specific Gravity, Urine: 1.029 (ref 1.005–1.030)
pH: 5 (ref 5.0–8.0)

## 2022-01-13 LAB — CBC
HCT: 45.5 % (ref 39.0–52.0)
Hemoglobin: 15.2 g/dL (ref 13.0–17.0)
MCH: 30.8 pg (ref 26.0–34.0)
MCHC: 33.4 g/dL (ref 30.0–36.0)
MCV: 92.3 fL (ref 80.0–100.0)
Platelets: 174 10*3/uL (ref 150–400)
RBC: 4.93 MIL/uL (ref 4.22–5.81)
RDW: 12.4 % (ref 11.5–15.5)
WBC: 7.5 10*3/uL (ref 4.0–10.5)
nRBC: 0 % (ref 0.0–0.2)

## 2022-01-13 LAB — LIPASE, BLOOD: Lipase: 38 U/L (ref 11–51)

## 2022-01-13 MED ORDER — AMOXICILLIN-POT CLAVULANATE 875-125 MG PO TABS
1.0000 | ORAL_TABLET | Freq: Once | ORAL | Status: AC
  Administered 2022-01-13: 1 via ORAL
  Filled 2022-01-13: qty 1

## 2022-01-13 MED ORDER — SUCRALFATE 1 G PO TABS: 1.0000 g | ORAL_TABLET | Freq: Four times a day (QID) | ORAL | 0 refills | Status: AC

## 2022-01-13 MED ORDER — SODIUM CHLORIDE 0.9 % IV BOLUS
1000.0000 mL | Freq: Once | INTRAVENOUS | Status: AC
  Administered 2022-01-13: 1000 mL via INTRAVENOUS

## 2022-01-13 MED ORDER — ONDANSETRON HCL 4 MG/2ML IJ SOLN
4.0000 mg | Freq: Once | INTRAMUSCULAR | Status: AC
  Administered 2022-01-13: 4 mg via INTRAVENOUS
  Filled 2022-01-13: qty 2

## 2022-01-13 MED ORDER — IOHEXOL 300 MG/ML  SOLN
100.0000 mL | Freq: Once | INTRAMUSCULAR | Status: AC | PRN
  Administered 2022-01-13: 100 mL via INTRAVENOUS

## 2022-01-13 MED ORDER — AMOXICILLIN-POT CLAVULANATE 875-125 MG PO TABS: 1.0000 | ORAL_TABLET | Freq: Two times a day (BID) | ORAL | 0 refills | Status: AC

## 2022-01-13 MED ORDER — PANTOPRAZOLE SODIUM 40 MG PO TBEC: 40.0000 mg | DELAYED_RELEASE_TABLET | Freq: Every day | ORAL | 1 refills | Status: AC

## 2022-01-13 MED ORDER — KETOROLAC TROMETHAMINE 30 MG/ML IJ SOLN
15.0000 mg | Freq: Once | INTRAMUSCULAR | Status: AC
  Administered 2022-01-13: 15 mg via INTRAVENOUS
  Filled 2022-01-13: qty 1

## 2022-01-13 NOTE — ED Provider Notes (Signed)
[facility] Provider Note    Event Date/Time   First MD Initiated Contact with Patient 01/13/22 2114     (approximate)  History   Chief Complaint: Abdominal Pain  HPI  Bobby Payne is a 61 y.o. male with a past medical history of asthma, COPD, emphysema, daily smoker, presents to the emergency department for abdominal pain.  According to the patient for the past 3 to 4 days he has been experiencing pain across the upper abdomen mostly in the left upper quadrant.  States it is worse when he eats, pain will sometimes last 30 minutes to an hour and then resolve but more recently it has been lasting longer.  Patient states today he had similar pain along with some nausea and several episodes of vomiting.  Patient denies any fever.  Does state intermittent diarrhea.  No urinary symptoms.  Physical Exam   Triage Vital Signs: ED Triage Vitals  Enc Vitals Group     BP 01/13/22 1847 126/72     Pulse Rate 01/13/22 1847 66     Resp 01/13/22 1847 18     Temp 01/13/22 1847 98.2 F (36.8 C)     Temp Source 01/13/22 1847 Oral     SpO2 01/13/22 1847 93 %     Weight 01/13/22 1848 184 lb (83.5 kg)     Height 01/13/22 1848 5\' 9"  (1.753 m)     Head Circumference --      Peak Flow --      Pain Score 01/13/22 1848 7     Pain Loc --      Pain Edu? --      Excl. in GC? --     Most recent vital signs: Vitals:   01/13/22 1847  BP: 126/72  Pulse: 66  Resp: 18  Temp: 98.2 F (36.8 C)  SpO2: 93%    General: Awake, no distress.  CV:  Good peripheral perfusion.  Regular rate and rhythm  Resp:  Normal effort.  Equal breath sounds bilaterally.  Abd:  No distention.  Soft, mild epigastric to left upper quadrant tenderness.  No right upper quadrant tenderness.  No rebound or guarding.   ED Results / Procedures / Treatments   EKG  EKG viewed and interpreted by myself shows a normal sinus rhythm at 63 bpm with a narrow QRS, normal axis, normal intervals, no  concerning ST changes.  RADIOLOGY  I have reviewed and interpreted the CT images.  I do not see any obvious obstruction.  Patient does appear to have some thickening of the stomach, possibly indicating gastritis, official radiology read pending.    MEDICATIONS ORDERED IN ED: Medications  sodium chloride 0.9 % bolus 1,000 mL (1,000 mLs Intravenous New Bag/Given 01/13/22 2139)  ondansetron [facility]) injection 4 mg (4 mg Intravenous Given 01/13/22 2139)  ketorolac (TORADOL) 30 MG/ML injection 15 mg (15 mg Intravenous Given 01/13/22 2140)     IMPRESSION / MDM / ASSESSMENT AND PLAN / ED COURSE  I reviewed the triage vital signs and the nursing notes.  Patient's presentation is most consistent with acute presentation with potential threat to life or bodily function.  Patient presents emergency department for abdominal pain in the epigastric to left upper quadrant for the past 4 days.  Seems to be associated with food, worse when eating.  Does not appear to be associated with any specific type of food.  Patient states he has had this many times in the past but has always been  brief this is lasting longer and now this morning was lasting for many hours associate with nausea and vomiting.  Does states some diarrhea.  Differential would include pancreatitis, gastritis, gastroenteritis, peptic ulcer disease, gallbladder pathology.  Patient's lab work is overall reassuring with a normal CBC with a normal white blood cell count, chemistry is reassuring and lipase is normal.  LFTs are normal.  Urinalysis shows no concerning findings besides a mild amount of ketones.  We will place an IV, IV hydrate with a liter of fluids, treat pain and nausea we will obtain CT imaging to further evaluate.  Patient is agreeable to plan of care.  CT read as possible duodenitis.  We will discharge on Protonix and sucralfate we will also add an antibiotic (Augmentin) as a precaution.  FINAL CLINICAL IMPRESSION(S) / ED  DIAGNOSES   Upper abdominal pain Nausea vomiting    Note:  This document was prepared using Dragon voice recognition software and may include unintentional dictation errors.   Minna Antis, MD 01/13/22 2302

## 2022-01-13 NOTE — ED Provider Triage Note (Signed)
  Emergency Medicine Provider Triage Evaluation Note  Bobby Payne , a 61 y.o.male,  was evaluated in triage.  Pt complains of abdominal pain x 4 days.  He states that he predominantly feels epigastric pain after he eats.  He states he has had similar episodes in the past.  Reports intermittent episodes of vomiting as well.  Denies any other symptoms.   Review of Systems  Positive: Epigastric pain. Negative: Denies fever, chest pain, vomiting  Physical Exam   Vitals:   01/13/22 1847  BP: 126/72  Pulse: 66  Resp: 18  Temp: 98.2 F (36.8 C)  SpO2: 93%   Gen:   Awake, no distress   Resp:  Normal effort  MSK:   Moves extremities without difficulty  Other:  No abdominal tenderness at this time.  Medical Decision Making  Given the patient's initial medical screening exam, the following diagnostic evaluation has been ordered. The patient will be placed in the appropriate treatment space, once one is available, to complete the evaluation and treatment. I have discussed the plan of care with the patient and I have advised the patient that an ED physician or mid-level practitioner will reevaluate their condition after the test results have been received, as the results may give them additional insight into the type of treatment they may need.    Diagnostics: Labs, UA  Treatments: none immediately   Varney Daily, [place] 01/13/22 1854

## 2022-01-13 NOTE — ED Triage Notes (Signed)
Pt to er, pt states that for the past 4 days he has been having abd pain, states that his pain is worse when he eats.  Pt states that he has had similar episodes in the past, but it would only last for an hour.  States that he has also been vomiting anything he eats.

## 2022-01-13 NOTE — Discharge Instructions (Signed)
Please follow-up with GI medicine by calling the number provided to arrange further evaluation and consideration for possible endoscopy.  Please begin taking your medications as prescribed.  The sucralfate tablet should be taken before breakfast lunch dinner and before going to bed for the next 2 weeks.  The Protonix should be taken each morning for the next 2 months.  Return to the emergency department if your pain significantly worsens if you develop a fever or for any other symptom personally concerning to yourself.

## 2022-01-13 NOTE — ED Notes (Signed)
Blood drawn via 21 gauge butterfly from L hand.

## 2022-08-14 NOTE — Congregational Nurse Program (Signed)
  Dept: [phone]   Congregational Nurse Program Note  Date of Encounter: 08/14/2022 Client to [facility] with request for assistance with a food stamp application. Application completed. RN to take to [facility]. Client appreciative of assistance. Francesco Runner BSN, RN Past Medical History: Past Medical History:  Diagnosis Date   Asthma    COPD (chronic obstructive pulmonary disease) (HCC)    Emphysema lung (HCC)     Encounter Details:  CNP Questionnaire - 08/14/22 1217       Questionnaire   Ask client: Do you give verbal consent for me to treat you today? Yes    Student Assistance N/A    Location Patient Served  [facility]    Visit Setting with Client Organization    Patient Status Unhoused    Insurance Unknown    Insurance/Financial Assistance Referral N/A    Medication N/A    Medical Provider No    Screening Referrals Made N/A    Medical Referrals Made N/A    Medical Appointment Made N/A    Recently w/o PCP, now 1st time PCP visit completed due to CNs referral or appointment made N/A    Food Have Food Insecurities   assisted with food stamp application   Transportation N/A    Housing/Utilities No permanent housing    Interpersonal Safety N/A    Interventions Advocate/Support    Abnormal to Normal Screening Since Last CN Visit N/A    Screenings CN Performed N/A    Sent Client to Lab for: N/A    Did client attend any of the following based off CNs referral or appointments made? N/A    ED Visit Averted N/A    Life-Saving Intervention Made N/A

## 2022-09-11 NOTE — Congregational Nurse Program (Signed)
  Dept: [phone]   Congregational Nurse Program Note  Date of Encounter: 09/11/2022 Client to [facility] day center to advise that he has completed the interview process with [employer]. He may be starting as soon as Monday. He also shared that his father died this morning in his home town in [place]. He was able to tell some stories about his father. He appeared to be grieving appropriately. He has health insurance through the exchange and will have health insurance through [facility] after 30 days. Discussed with client the possibility of setting him up with a new PCP.  Francesco Runner BSN, RN Past Medical History: Past Medical History:  Diagnosis Date   Asthma    COPD (chronic obstructive pulmonary disease) (HCC)    Emphysema lung (HCC)     Encounter Details:  CNP Questionnaire - 09/11/22 0940       Questionnaire   Ask client: Do you give verbal consent for me to treat you today? Yes    Student Assistance N/A    Location Patient Served  [facility]    Visit Setting with Client Organization    Patient Status Unhoused   currently staying at [employer] or [place] [facility]   insurance through the  exchange   Insurance/Financial Assistance Referral N/A    Medication N/A    Medical Provider No   discussed need for PCP   Screening Referrals Made N/A    Medical Referrals Made N/A   Discussed need for PCP   Medical Appointment Made N/A    Recently w/o PCP, now 1st time PCP visit completed due to CNs referral or appointment made N/A    Food N/A   now has food stamps   Transportation N/A   will use [employer] bus and walks   Housing/Utilities No permanent housing   Currently staying at the [employer] N/A    Interventions Advocate/Support    Abnormal to Normal Screening Since Last CN Visit N/A    Screenings CN Performed N/A    Sent Client to Lab for: N/A    Did client attend any of the following based off CNs referral or  appointments made? N/A    ED Visit Averted N/A    Life-Saving Intervention Made N/A

## 2022-11-22 ENCOUNTER — Other Ambulatory Visit: Payer: Self-pay | Admitting: Pulmonary Disease

## 2022-11-22 DIAGNOSIS — J441 Chronic obstructive pulmonary disease with (acute) exacerbation: Secondary | ICD-10-CM

## 2022-11-29 ENCOUNTER — Inpatient Hospital Stay: Admission: RE | Admit: 2022-11-29 | Payer: Self-pay | Source: Ambulatory Visit

## 2022-12-11 ENCOUNTER — Inpatient Hospital Stay: Admission: RE | Admit: 2022-12-11 | Payer: Self-pay | Source: Ambulatory Visit

## 2022-12-23 ENCOUNTER — Inpatient Hospital Stay: Admission: RE | Admit: 2022-12-23 | Payer: 59 | Source: Ambulatory Visit

## 2023-09-19 ENCOUNTER — Other Ambulatory Visit: Payer: Self-pay | Admitting: Pulmonary Disease

## 2023-09-19 DIAGNOSIS — R053 Chronic cough: Secondary | ICD-10-CM

## 2023-09-19 DIAGNOSIS — F17200 Nicotine dependence, unspecified, uncomplicated: Secondary | ICD-10-CM

## 2023-09-19 DIAGNOSIS — R911 Solitary pulmonary nodule: Secondary | ICD-10-CM

## 2023-09-25 ENCOUNTER — Ambulatory Visit

## 2023-10-02 ENCOUNTER — Ambulatory Visit
Admission: RE | Admit: 2023-10-02 | Discharge: 2023-10-02 | Disposition: A | Discharge status: Home / Self Care | Source: Ambulatory Visit | Attending: Pulmonary Disease | Admitting: Pulmonary Disease

## 2023-10-02 DIAGNOSIS — R911 Solitary pulmonary nodule: Secondary | ICD-10-CM | POA: Diagnosis present

## 2023-10-02 DIAGNOSIS — R053 Chronic cough: Secondary | ICD-10-CM | POA: Diagnosis present

## 2023-10-02 DIAGNOSIS — F17200 Nicotine dependence, unspecified, uncomplicated: Secondary | ICD-10-CM | POA: Diagnosis present

## 2024-01-30 ENCOUNTER — Other Ambulatory Visit: Payer: Self-pay | Admitting: Pulmonary Disease

## 2024-01-30 DIAGNOSIS — R918 Other nonspecific abnormal finding of lung field: Secondary | ICD-10-CM

## 2024-02-02 ENCOUNTER — Other Ambulatory Visit

## 2024-02-03 ENCOUNTER — Other Ambulatory Visit
